# Patient Record
Sex: Female | Born: 1995 | Hispanic: Yes | Marital: Single | Smoking: Never smoker
Health system: Southern US, Community
[De-identification: ages and names within clinical notes are randomized; demographics above are authoritative.]

## PROBLEM LIST (undated history)

## (undated) ENCOUNTER — Ambulatory Visit: Admission: EM | Payer: Self-pay

---

## 2019-04-22 ENCOUNTER — Other Ambulatory Visit: Payer: Self-pay

## 2019-04-22 ENCOUNTER — Emergency Department: Payer: No Typology Code available for payment source

## 2019-04-22 ENCOUNTER — Emergency Department
Admission: EM | Admit: 2019-04-22 | Discharge: 2019-04-22 | Disposition: A | Discharge status: Home / Self Care | Payer: No Typology Code available for payment source | Attending: Emergency Medicine | Admitting: Emergency Medicine

## 2019-04-22 ENCOUNTER — Encounter: Payer: Self-pay | Admitting: Emergency Medicine

## 2019-04-22 DIAGNOSIS — Z23 Encounter for immunization: Secondary | ICD-10-CM | POA: Diagnosis not present

## 2019-04-22 DIAGNOSIS — Y990 Civilian activity done for income or pay: Secondary | ICD-10-CM | POA: Diagnosis not present

## 2019-04-22 DIAGNOSIS — W109XXA Fall (on) (from) unspecified stairs and steps, initial encounter: Secondary | ICD-10-CM | POA: Diagnosis not present

## 2019-04-22 DIAGNOSIS — Y929 Unspecified place or not applicable: Secondary | ICD-10-CM | POA: Insufficient documentation

## 2019-04-22 DIAGNOSIS — Y939 Activity, unspecified: Secondary | ICD-10-CM | POA: Insufficient documentation

## 2019-04-22 DIAGNOSIS — W19XXXA Unspecified fall, initial encounter: Secondary | ICD-10-CM

## 2019-04-22 DIAGNOSIS — S81812A Laceration without foreign body, left lower leg, initial encounter: Secondary | ICD-10-CM | POA: Diagnosis not present

## 2019-04-22 DIAGNOSIS — S8992XA Unspecified injury of left lower leg, initial encounter: Secondary | ICD-10-CM | POA: Diagnosis present

## 2019-04-22 MED ORDER — TETANUS-DIPHTH-ACELL PERTUSSIS 5-2.5-18.5 LF-MCG/0.5 IM SUSP
0.5000 mL | Freq: Once | INTRAMUSCULAR | Status: AC
  Administered 2019-04-22: 0.5 mL via INTRAMUSCULAR
  Filled 2019-04-22: qty 0.5

## 2019-04-22 MED ORDER — LIDOCAINE-EPINEPHRINE-TETRACAINE (LET) SOLUTION: 3.0000 mL | Freq: Once | NASAL | Status: DC

## 2019-04-22 NOTE — ED Triage Notes (Addendum)
Pt works at [employer], has laceration to left knee after tripping and  falling on the stairs at work and hit left knee on a machine. Pt states she only fell onto her left knee.  Pt here with manager on call, pt does not have ID with her, but manager able to positively identify her for UDS purposes.   Tetanus status uknown.  Pt requires interpter.

## 2019-04-22 NOTE — ED Notes (Signed)
First Nurse Note: Pt to ED from work, pt was coming down a ladder and fell, scraping her knee on the conveyer belt. Pt is in NAD at this time.

## 2019-04-22 NOTE — ED Notes (Signed)
W/C complete and walked to lab by this tech  

## 2019-04-22 NOTE — ED Provider Notes (Signed)
[facility] Emergency Department Provider Note  ____________________________________________  Time seen: Approximately 8:07 PM  I have reviewed the triage vital signs and the nursing notes.   HISTORY  Chief Complaint Laceration    HPI Amy Carrillo is a 23 y.o. female that presents to emergency department for evaluation of knee pain and laceration after injury at work today.  Patient slipped on some stairs at work and hit her left knee on a piece of machinery.  She used her left arm to brace herself.  She did not hit her head or lose consciousness.  She is unsure of last tetanus.  No additional injuries.   History reviewed. No pertinent past medical history.  There are no active problems to display for this patient.   Past Surgical History:  Procedure Laterality Date  . CESAREAN SECTION      Prior to Admission medications   Not on File    Allergies Patient has no known allergies.  No family history on file.  Social History Social History   Tobacco Use  . Smoking status: Never Smoker  . Smokeless tobacco: Never Used  Substance Use Topics  . Alcohol use: Not on file  . Drug use: Not on file     Review of Systems  Respiratory: No SOB. Gastrointestinal: No abdominal pain.  No nausea, no vomiting.  Musculoskeletal: Positive for knee pain. Skin: Negative for rash, abrasions, ecchymosis.  Positive for laceration. Neurological: Negative for headaches   ____________________________________________   PHYSICAL EXAM:  VITAL SIGNS: ED Triage Vitals  Enc Vitals Group     BP 04/22/19 1711 106/67     Pulse Rate 04/22/19 1711 (!) 58     Resp 04/22/19 1711 18     Temp 04/22/19 1711 98.7 F (37.1 C)     Temp Source 04/22/19 1711 Oral     SpO2 04/22/19 1711 100 %     Weight 04/22/19 1715 135 lb (61.2 kg)     Height 04/22/19 1715 5\' 5"  (1.651 m)     Head Circumference --      Peak Flow --      Pain Score 04/22/19 1715 6     Pain Loc --     Pain Edu? --      Excl. in [place]? --      Constitutional: Alert and oriented. Well appearing and in no acute distress. Eyes: Conjunctivae are normal. PERRL. EOMI. Head: Atraumatic. ENT:      Ears:      Nose: No congestion/rhinnorhea.      Mouth/Throat: Mucous membranes are moist.  Neck: No stridor.   Cardiovascular: Normal rate, regular rhythm.  Good peripheral circulation. Respiratory: Normal respiratory effort without tachypnea or retractions. Lungs CTAB. Good air entry to the bases with no decreased or absent breath sounds. Musculoskeletal: Full range of motion to all extremities. No gross deformities appreciated. Neurologic:  Normal speech and language. No gross focal neurologic deficits are appreciated.  Skin:  Skin is warm, dry.  1 cm shallow well approximated laceration to anterior left proximal shin. Psychiatric: Mood and affect are normal. Speech and behavior are normal. Patient exhibits appropriate insight and judgement.   ____________________________________________   LABS (all labs ordered are listed, but only abnormal results are displayed)  Labs Reviewed - No data to display ____________________________________________  EKG   ____________________________________________  RADIOLOGY Robinette Haines, personally viewed and evaluated these images (plain radiographs) as part of my medical decision making, as well as reviewing the written report  by the radiologist.  Dg Knee Complete 4 Views Left  Result Date: 04/22/2019 CLINICAL DATA:  Larey Seat down stairs at work. Left knee injury and pain. EXAM: LEFT KNEE - COMPLETE 4+ VIEW COMPARISON:  None. FINDINGS: No evidence of fracture, dislocation, or knee joint effusion. No evidence arthropathy or other bone lesions. Small amount of soft tissue gas is seen along the anterior aspect of the anterior tibial tubercle, however there is no evidence of radiopaque foreign body. IMPRESSION: Small amount of soft tissue gas along the  anterior aspect of the anterior tibial tubercle. No evidence of radiopaque foreign body. No evidence of fracture or joint effusion Electronically Signed   By: Danae Orleans M.D.   On: 04/22/2019 18:42    ____________________________________________    PROCEDURES  Procedure(s) performed:    Procedures  LACERATION REPAIR Performed by: Enid Derry  Consent: Verbal consent obtained.  Consent given by: patient  Prepped and Draped in normal sterile fashion  Wound explored: No foreign bodies   Laceration Location: left leg  Laceration Length: 1 cm  Anesthesia: None  Local anesthetic: None  Anesthetic total: n/a  Irrigation method: syringe  Amount of cleaning: normal saline  Skin closure: dermabond  Patient tolerance: Patient tolerated the procedure well with no immediate complications.  Medications  Tdap (BOOSTRIX) injection 0.5 mL (has no administration in time range)     ____________________________________________   INITIAL IMPRESSION / ASSESSMENT AND PLAN / ED COURSE  Pertinent labs & imaging results that were available during my care of the patient were reviewed by me and considered in my medical decision making (see chart for details).  Review of the [place] CSRS was performed in accordance of the [facility] prior to dispensing any controlled drugs.    Patient presented to emergency department for evaluation of injury at work today.  Vital signs and exam are reassuring.  X-ray negative for acute bony abnormalities.  Laceration is very shallow and well approximated.  It was repaired with Dermabond.  Tetanus shot was updated.  Patient is to follow up with primary care as directed. Patient is given ED precautions to return to the ED for any worsening or new symptoms.   Amy Carrillo was evaluated in Emergency Department on 04/22/2019 for the symptoms described in the history of present illness. She was evaluated in the context of the global COVID-19 pandemic, which  necessitated consideration that the patient might be at risk for infection with the SARS-CoV-2 virus that causes COVID-19. Institutional protocols and algorithms that pertain to the evaluation of patients at risk for COVID-19 are in a state of rapid change based on information released by regulatory bodies including the CDC and federal and state organizations. These policies and algorithms were followed during the patient's care in the ED.  ____________________________________________  FINAL CLINICAL IMPRESSION(S) / ED DIAGNOSES  Final diagnoses:  Fall, initial encounter      NEW MEDICATIONS STARTED DURING THIS VISIT:  ED Discharge Orders    None          This chart was dictated using voice recognition software/Dragon. Despite best efforts to proofread, errors can occur which can change the meaning. Any change was purely unintentional.    Enid Derry, PA-C 04/22/19 2337    Arnaldo Natal, MD 04/22/19 [phone]

## 2019-09-02 ENCOUNTER — Other Ambulatory Visit: Payer: Self-pay

## 2019-09-02 ENCOUNTER — Emergency Department
Admission: EM | Admit: 2019-09-02 | Discharge: 2019-09-02 | Disposition: A | Discharge status: Home / Self Care | Payer: Self-pay | Attending: Student | Admitting: Student

## 2019-09-02 ENCOUNTER — Emergency Department: Payer: Self-pay

## 2019-09-02 DIAGNOSIS — O4690 Antepartum hemorrhage, unspecified, unspecified trimester: Secondary | ICD-10-CM

## 2019-09-02 DIAGNOSIS — N939 Abnormal uterine and vaginal bleeding, unspecified: Secondary | ICD-10-CM

## 2019-09-02 DIAGNOSIS — O209 Hemorrhage in early pregnancy, unspecified: Secondary | ICD-10-CM | POA: Insufficient documentation

## 2019-09-02 DIAGNOSIS — Z3A00 Weeks of gestation of pregnancy not specified: Secondary | ICD-10-CM | POA: Insufficient documentation

## 2019-09-02 LAB — CBC
HCT: 37.2 % (ref 36.0–46.0)
Hemoglobin: 12.2 g/dL (ref 12.0–15.0)
MCH: 27.1 pg (ref 26.0–34.0)
MCHC: 32.8 g/dL (ref 30.0–36.0)
MCV: 82.5 fL (ref 80.0–100.0)
Platelets: 228 10*3/uL (ref 150–400)
RBC: 4.51 MIL/uL (ref 3.87–5.11)
RDW: 14.6 % (ref 11.5–15.5)
WBC: 7.7 10*3/uL (ref 4.0–10.5)
nRBC: 0 % (ref 0.0–0.2)

## 2019-09-02 LAB — ABO/RH: ABO/RH(D): A POS

## 2019-09-02 LAB — URINALYSIS, COMPLETE (UACMP) WITH MICROSCOPIC
Bacteria, UA: NONE SEEN
Bilirubin Urine: NEGATIVE
Glucose, UA: NEGATIVE mg/dL
Ketones, ur: NEGATIVE mg/dL
Leukocytes,Ua: NEGATIVE
Nitrite: NEGATIVE
Protein, ur: 30 mg/dL — AB
Specific Gravity, Urine: 1.027 (ref 1.005–1.030)
pH: 5 (ref 5.0–8.0)

## 2019-09-02 LAB — HIV ANTIBODY (ROUTINE TESTING W REFLEX): HIV Screen 4th Generation wRfx: NONREACTIVE

## 2019-09-02 LAB — BASIC METABOLIC PANEL
Anion gap: 7 (ref 5–15)
BUN: 13 mg/dL (ref 6–20)
CO2: 27 mmol/L (ref 22–32)
Calcium: 8.7 mg/dL — ABNORMAL LOW (ref 8.9–10.3)
Chloride: 103 mmol/L (ref 98–111)
Creatinine, Ser: 0.75 mg/dL (ref 0.44–1.00)
GFR calc Af Amer: >60 mL/min (ref >60)
GFR calc non Af Amer: >60 mL/min (ref >60)
Glucose, Bld: 105 mg/dL — ABNORMAL HIGH (ref 70–99)
Potassium: 3.6 mmol/L (ref 3.5–5.1)
Sodium: 137 mmol/L (ref 135–145)

## 2019-09-02 LAB — WET PREP, GENITAL
Clue Cells Wet Prep HPF POC: NONE SEEN
Sperm: NONE SEEN
Trich, Wet Prep: NONE SEEN
Yeast Wet Prep HPF POC: NONE SEEN

## 2019-09-02 LAB — CHLAMYDIA/NGC RT PCR (ARMC ONLY)
Chlamydia Tr: NOT DETECTED
N gonorrhoeae: NOT DETECTED

## 2019-09-02 LAB — HCG, QUANTITATIVE, PREGNANCY: hCG, Beta Chain, Quant, S: 99 m[IU]/mL — ABNORMAL HIGH (ref <5)

## 2019-09-02 NOTE — ED Notes (Signed)
ED Provider at bedside w/ interpreter.

## 2019-09-02 NOTE — ED Provider Notes (Signed)
[facility] Emergency Department Provider Note  ____________________________________________   First MD Initiated Contact with Patient 09/02/19 1521     (approximate)  I have reviewed the triage vital signs and the nursing notes.  History  Chief Complaint Vaginal Bleeding    HPI Amy Carrillo is a 24 y.o. female who presents for vaginal bleeding in early pregnancy. LMP end of January. Started to notice small amount of vaginal bleeding yesterday. No large clots. ~3 pads/day. No other vaginal discharge. Some lower abdominal cramping that feels like menstrual cramps. Mild, constant since onset this afternoon, no radiation, no alleviating/agravating components. Does desire STD testing. [id number]. Has not seen OB yet for this pregnancy, has an appointment scheduled for Friday, 3/19.    Past Medical Hx History reviewed. No pertinent past medical history.  Problem List There are no problems to display for this patient.   Past Surgical Hx Past Surgical History:  Procedure Laterality Date  . CESAREAN SECTION      Medications Prior to Admission medications   Not on File    Allergies Patient has no known allergies.  Family Hx History reviewed. No pertinent family history.  Social Hx Social History   Tobacco Use  . Smoking status: Never Smoker  . Smokeless tobacco: Never Used  Substance Use Topics  . Alcohol use: Not Currently  . Drug use: Not on file     Review of Systems  Constitutional: Negative for fever, chills. Eyes: Negative for visual changes. ENT: Negative for sore throat. Cardiovascular: Negative for chest pain. Respiratory: Negative for shortness of breath. Gastrointestinal: Negative for nausea, vomiting.  Genitourinary: Negative for dysuria. + vaginal bleeding Musculoskeletal: Negative for leg swelling. Skin: Negative for rash. Neurological: Negative for headaches.   Physical Exam  Vital Signs: ED Triage Vitals  Enc Vitals  Group     BP 09/02/19 1140 118/66     Pulse Rate 09/02/19 1140 76     Resp 09/02/19 1140 16     Temp 09/02/19 1140 98.7 F (37.1 C)     Temp Source 09/02/19 1140 Oral     SpO2 09/02/19 1140 99 %     Weight 09/02/19 1156 135 lb (61.2 kg)     Height 09/02/19 1156 5\' 5"  (1.651 m)     Head Circumference --      Peak Flow --      Pain Score 09/02/19 1156 5     Pain Loc --      Pain Edu? --      Excl. in [place]? --     Constitutional: Alert and oriented. Well appearing.  Head: Normocephalic. Atraumatic. Eyes: Conjunctivae clear, sclera anicteric. Pupils equal and symmetric. Nose: No masses or lesions. No congestion or rhinorrhea. Mouth/Throat: Wearing mask.  Neck: No stridor. Trachea midline.  Cardiovascular: Normal rate, regular rhythm. Extremities well perfused. Respiratory: Normal respiratory effort.  Lungs CTAB. Gastrointestinal: Soft. Non-distended. Mild suprapubic tenderness, no rebound or guarding. Remainder abdomen non-tender.  Genitourinary: RN chaperone present. Normal appearing external genitalia. No rashes/lesions. Small amount of blood in vaginal canal. No clots or active bleeding appreciate. Normal appearing cervix. No CMT. Swabs obtained and sent. Musculoskeletal: No lower extremity edema. No deformities. Neurologic:  Normal speech and language. No gross focal or lateralizing neurologic deficits are appreciated.  Skin: Skin is warm, dry and intact. No rash noted. Psychiatric: Mood and affect are appropriate for situation.  EKG  N/A   Radiology  OB US: IMPRESSION:  Non-localization of the pregnancy on this  scan. No intrauterine gestational sac. No abnormal ovarian or adnexal masses. Sonographic differential diagnosis includes intrauterine gestation too early to visualize, spontaneous abortion or occult ectopic gestation. Recommend close clinical follow-up and serial serum beta HCG monitoring, with repeat obstetric scan as warranted by beta HCG levels and clinical  assessment.    Procedures  Procedure(s) performed (including critical care):  Procedures   Initial Impression / Assessment and Plan / MDM / ED Course  24 y.o. female who presents to the ED for vaginal bleeding in early pregnancy.  Ddx: miscarriage, threatened miscarriage, ectopic, implantation bleeding, infection, PUL  Plan: labs, imaging  Blood type is Rh positive. Does not require Rhogam. Wet prep negative. UA negative for infection or bacteria. [place] as above, patient with pregnancy unknown location. Discussed (w/ interpretor at bedside) [place] results, and the wide possibilities including early pregnancy, pregnancy loss, or ectopic. Needs 48 hour beta either with her OB in clinic or here in ED and follow up. She voices understanding of this. Given strict return precautions.   _______________________________  As part of my medical decision making I have reviewed available labs, radiology tests, reviewed old records.  Final Clinical Impression(s) / ED Diagnosis  Final diagnoses:  Vaginal bleeding in pregnancy       Note:  This document was prepared using Dragon voice recognition software and may include unintentional dictation errors.   Miguel Aschoff., MD 09/03/19 [phone]

## 2019-09-02 NOTE — ED Notes (Signed)
Discharge completed with interpreter.

## 2019-09-02 NOTE — ED Triage Notes (Signed)
Spanish interpreter used for triage.  Monday took a home pregnancy test that came back positive. Started with vaginal bleeding yesterday. States notices blood when she goes to bathroom. Denies clots. States feels like period cramps.

## 2019-09-02 NOTE — Discharge Instructions (Addendum)
Thank you for letting us take care of you in the emergency department today.  As we discussed, you will need a repeat blood test in 48 hours (Monday, 3/15).  Please call your OB/GYN doctor to schedule this.  If you are unable to see them in the clinic, you should return to the emergency department for this repeat testing.  You can take Tylenol as directed on the box to help with any additional cramping or abdominal pain.  Please return to the emergency department for any severe worsening of your abdominal pain, increased bleeding, passing out, or any other signs/symptoms that are concerning to you.

## 2019-09-03 LAB — RPR: RPR Ser Ql: NONREACTIVE

## 2019-09-15 ENCOUNTER — Ambulatory Visit: Payer: Self-pay | Attending: Internal Medicine

## 2019-09-15 DIAGNOSIS — Z23 Encounter for immunization: Secondary | ICD-10-CM

## 2019-09-15 NOTE — Progress Notes (Signed)
   Covid-19 Vaccination Clinic  Name:  Amy Carrillo    MRN: [id number] DOB: 1996-03-18  09/15/2019  [name] was observed post Covid-19 immunization for 15 minutes without incident. She was provided with Vaccine Information Sheet and instruction to access the V-Safe system.   [name] was instructed to call 911 with any severe reactions post vaccine: Marland Kitchen Difficulty breathing  . Swelling of face and throat  . A fast heartbeat  . A bad rash all over body  . Dizziness and weakness   Immunizations Administered    Name Date Dose VIS Date Route   Pfizer COVID-19 Vaccine 09/15/2019  1:38 PM 0.3 mL 06/02/2019 Intramuscular   Manufacturer: [employer], [employer]   Lot: [id number]   NDC: [address]-7

## 2019-10-06 ENCOUNTER — Ambulatory Visit: Payer: Self-pay | Attending: Internal Medicine

## 2019-10-06 DIAGNOSIS — Z23 Encounter for immunization: Secondary | ICD-10-CM

## 2019-10-06 NOTE — Progress Notes (Signed)
   Covid-19 Vaccination Clinic  Name:  Amy Carrillo    MRN: [id number] DOB: [birth year]  10/06/2019  [name] was observed post Covid-19 immunization for 15 minutes without incident. She was provided with Vaccine Information Sheet and instruction to access the V-Safe system.   [name] was instructed to call 911 with any severe reactions post vaccine: Marland Kitchen Difficulty breathing  . Swelling of face and throat  . A fast heartbeat  . A bad rash all over body  . Dizziness and weakness   Immunizations Administered    Name Date Dose VIS Date Route   Pfizer COVID-19 Vaccine 10/06/2019  1:21 PM 0.3 mL 06/02/2019 Intramuscular   Manufacturer: [employer], [employer]   Lot: [id number]   NDC: [address]-3

## 2021-10-16 ENCOUNTER — Other Ambulatory Visit: Payer: Self-pay | Admitting: Physician Assistant

## 2021-10-16 ENCOUNTER — Ambulatory Visit
Admission: RE | Admit: 2021-10-16 | Discharge: 2021-10-16 | Disposition: A | Discharge status: Home / Self Care | Payer: Worker's Compensation | Source: Ambulatory Visit | Attending: Physician Assistant | Admitting: Physician Assistant

## 2021-10-16 DIAGNOSIS — W319XXA Contact with unspecified machinery, initial encounter: Secondary | ICD-10-CM | POA: Insufficient documentation

## 2021-10-16 DIAGNOSIS — Y990 Civilian activity done for income or pay: Secondary | ICD-10-CM | POA: Diagnosis not present

## 2021-10-16 DIAGNOSIS — Y9289 Other specified places as the place of occurrence of the external cause: Secondary | ICD-10-CM | POA: Insufficient documentation

## 2021-10-16 DIAGNOSIS — S61300A Unspecified open wound of right index finger with damage to nail, initial encounter: Secondary | ICD-10-CM | POA: Insufficient documentation

## 2023-01-26 IMAGING — CR DG FINGER INDEX 2+V*R*
3 series · 3 of 3 positions shown · non-contrast
Comparison: None.

CLINICAL DATA: Right index finger nail pulled off, index finger
injury

EXAM:
RIGHT INDEX FINGER 2+V

[finger ap]
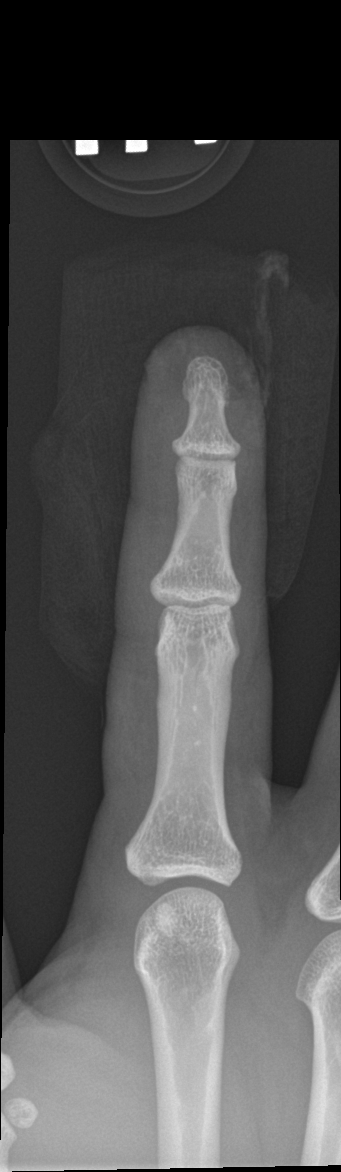

[finger obl]
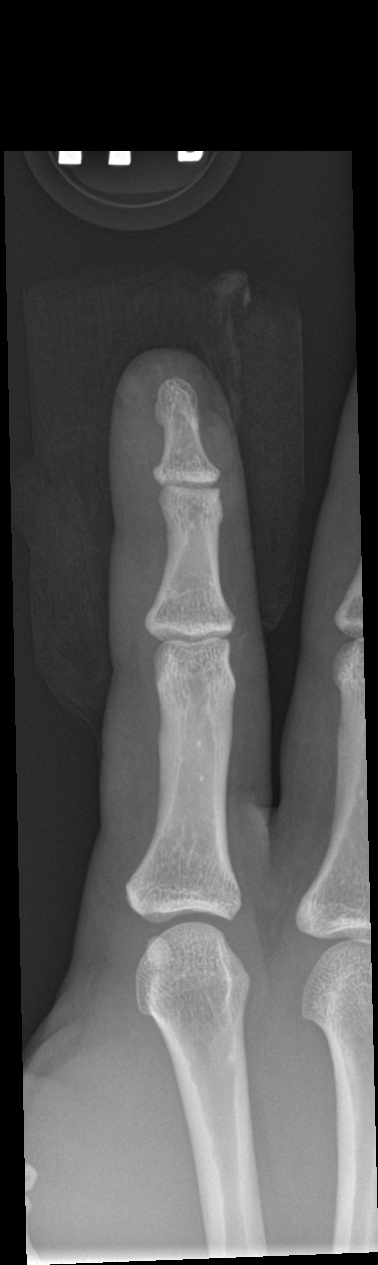

[finger lat]
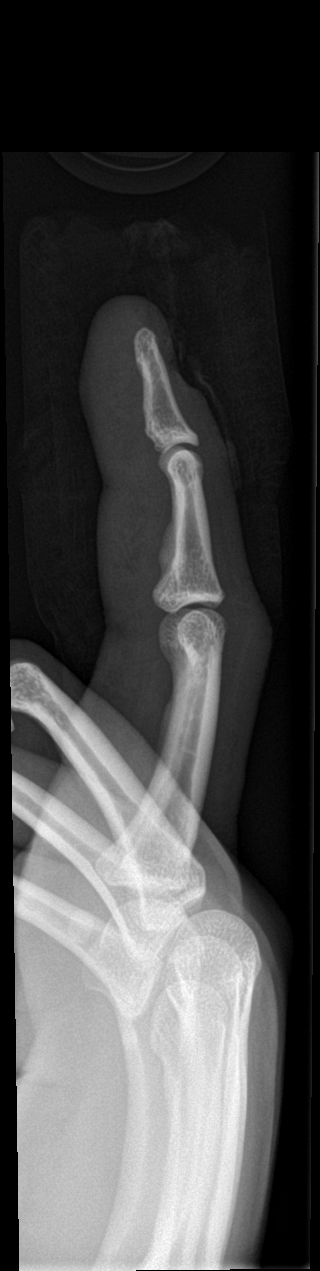

[3 of 3 positions shown; findings below may reference images not displayed]

FINDINGS: The index finger fingernail is not identified consistent with the
provided history. There is no underlying fracture. Alignment is
normal. The soft tissues are unremarkable. There is no soft tissue
gas or radiopaque foreign body.
IMPRESSION: 1. No acute fracture or dislocation. No radiopaque foreign body or
soft tissue gas.
2. Absence of the index finger fingernail consistent with the
provided history.
